# Patient Record
Sex: Male | Born: 2000 | Race: Black or African American | Hispanic: No | Marital: Single
Health system: Southern US, Community
[De-identification: ages and names within clinical notes are randomized; demographics above are authoritative.]

## PROBLEM LIST (undated history)

## (undated) DIAGNOSIS — J45909 Unspecified asthma, uncomplicated: Secondary | ICD-10-CM

## (undated) HISTORY — PX: HERNIA REPAIR: SHX51

## (undated) HISTORY — PX: LIP REPAIR: SHX440

---
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
## 2019-03-28 ENCOUNTER — Encounter (HOSPITAL_COMMUNITY): Payer: Self-pay | Admitting: Emergency Medicine

## 2019-03-28 ENCOUNTER — Encounter (HOSPITAL_COMMUNITY): Payer: Self-pay

## 2019-03-28 ENCOUNTER — Emergency Department (HOSPITAL_COMMUNITY)
Admission: EM | Admit: 2019-03-28 | Discharge: 2019-03-28 | Discharge status: Against Medical Advice | Payer: Medicaid Other | Attending: Emergency Medicine | Admitting: Emergency Medicine

## 2019-03-28 ENCOUNTER — Other Ambulatory Visit: Payer: Self-pay

## 2019-03-28 ENCOUNTER — Ambulatory Visit (HOSPITAL_COMMUNITY)
Admission: EM | Admit: 2019-03-28 | Discharge: 2019-03-28 | Disposition: A | Discharge status: Home / Self Care | Payer: Medicaid Other | Attending: Urgent Care | Admitting: Urgent Care

## 2019-03-28 DIAGNOSIS — R21 Rash and other nonspecific skin eruption: Secondary | ICD-10-CM | POA: Diagnosis not present

## 2019-03-28 DIAGNOSIS — Z5321 Procedure and treatment not carried out due to patient leaving prior to being seen by health care provider: Secondary | ICD-10-CM | POA: Insufficient documentation

## 2019-03-28 MED ORDER — CLOTRIMAZOLE-BETAMETHASONE 1-0.05 % EX CREA: TOPICAL_CREAM | CUTANEOUS | 0 refills | Status: AC

## 2019-03-28 NOTE — ED Provider Notes (Signed)
  [facility]   MRN: [id number] DOB: 2001-01-09  Subjective:   Robert Wiggins is a 18 y.o. male presenting for 1 week history of persistent left-sided groin rash.  Patient states that the rash is very itchy and when he scratches it can stinging at times.  Believes it is related to him sweating profusely at times, worse at night when he is asleep.  He has not tried any medications for relief.  He does have a history of eczema. Denies dysuria, hematuria, urinary frequency, penile discharge, penile swelling, testicular pain, testicular swelling, anal pain, groin pain.   No current facility-administered medications for this encounter.   Current Outpatient Medications:  .  ibuprofen (ADVIL,MOTRIN) 600 MG tablet, Take 1 tablet (600 mg total) by mouth every 6 (six) hours as needed for mild pain., Disp: 12 tablet, Rfl: 0 .  prednisoLONE acetate (PRED FORTE) 1 % ophthalmic suspension, Place 2 drops into the right eye 2 (two) times daily., Disp: 5 mL, Rfl: 0 .  triamcinolone cream (KENALOG) 0.1 %, Apply 1 application topically 2 (two) times daily. (Patient not taking: Reported on 10/03/2014), Disp: 45 g, Rfl: 0   Allergies  Allergen Reactions  . Pineapple Swelling    Swelling of lips    Past Medical History:  Diagnosis Date  . Asthma      Past Surgical History:  Procedure Laterality Date  . HERNIA REPAIR    . LIP REPAIR      Family History  Problem Relation Age of Onset  . Healthy Mother   . Healthy Father     Social History   Tobacco Use  . Smoking status: Passive Smoke Exposure - Never Smoker  . Smokeless tobacco: Never Used  Substance Use Topics  . Alcohol use: No  . Drug use: No    ROS   Objective:   Vitals: BP 115/77 (BP Location: Right Arm)   Pulse 63   Temp 98.3 F (36.8 C) (Oral)   Resp 16   Wt 130 lb (59 kg)   SpO2 100%   BMI 19.77 kg/m   Physical Exam Constitutional:      Appearance: Normal appearance. He is well-developed and normal weight.   HENT:     Head: Normocephalic and atraumatic.     Right Ear: External ear normal.     Left Ear: External ear normal.     Nose: Nose normal.     Mouth/Throat:     Pharynx: Oropharynx is clear.  Eyes:     Extraocular Movements: Extraocular movements intact.     Pupils: Pupils are equal, round, and reactive to light.  Cardiovascular:     Rate and Rhythm: Normal rate.  Pulmonary:     Effort: Pulmonary effort is normal.  Genitourinary:   Neurological:     Mental Status: He is alert and oriented to person, place, and time.  Psychiatric:        Mood and Affect: Mood normal.        Behavior: Behavior normal.      Assessment and Plan :   1. Groin rash   2. Rash and nonspecific skin eruption     Will cover for tinea cruris with clotrimazole betamethasone combination. Counseled patient on potential for adverse effects with medications prescribed/recommended today, ER and return-to-clinic precautions discussed, patient verbalized understanding.    Jaynee Eagles, [place] 03/29/19 [phone]

## 2019-03-28 NOTE — ED Triage Notes (Signed)
Pt reports rash to L upper thigh/groin area X several months. Pt concerned he has STD. Denies penile discharge, dysuria, pain, etc.

## 2019-03-28 NOTE — ED Triage Notes (Signed)
Pt states he has a rash on his upper left thigh. X 1 week .

## 2022-09-28 ENCOUNTER — Emergency Department (HOSPITAL_COMMUNITY)
Admission: EM | Admit: 2022-09-28 | Discharge: 2022-09-28 | Disposition: A | Discharge status: Home / Self Care | Payer: Medicaid Other | Attending: Emergency Medicine | Admitting: Emergency Medicine

## 2022-09-28 ENCOUNTER — Other Ambulatory Visit: Payer: Self-pay

## 2022-09-28 DIAGNOSIS — S00462A Insect bite (nonvenomous) of left ear, initial encounter: Secondary | ICD-10-CM | POA: Diagnosis present

## 2022-09-28 DIAGNOSIS — W57XXXA Bitten or stung by nonvenomous insect and other nonvenomous arthropods, initial encounter: Secondary | ICD-10-CM | POA: Insufficient documentation

## 2022-09-28 MED ORDER — DOXYCYCLINE HYCLATE 100 MG PO CAPS: 100.0000 mg | ORAL_CAPSULE | Freq: Two times a day (BID) | ORAL | 0 refills | Status: AC

## 2022-09-28 MED ORDER — DOXYCYCLINE HYCLATE 100 MG PO TABS
100.0000 mg | ORAL_TABLET | Freq: Once | ORAL | Status: AC
  Administered 2022-09-28: 100 mg via ORAL
  Filled 2022-09-28: qty 1

## 2022-09-28 NOTE — Discharge Instructions (Addendum)
You were seen in the emergency department today for possible tick bite to your left ear.  We are placing you on an antibiotic called doxycycline that you will take over the next 7 days.  I attached some instructions about tick bite information.  If you begin to have systemic symptoms of infection at that are outlined in the discharge instruction reading please return to the emergency department.

## 2022-09-28 NOTE — ED Provider Notes (Signed)
[place] EMERGENCY DEPARTMENT AT [facility] Provider Note   CSN: [id number] Arrival date & time: 09/28/22  0024     History  Chief Complaint  Patient presents with   Tick Removal    Robert Wiggins is a 22 y.o. male.  With past medical history of asthma who presents to the emergency department with concern over tick.  Patient states that about 1 and half days ago he was walking back from the grocery store through the woods when he hit a tree branch.  He states that then at work yesterday he felt something bothering his left ear.  He assumed that this was a "bump" and disregarded it.  He states that today he was still having irritation to the left ear so had his girlfriend look at it when she noticed that there was a small black bug on his ear.  She removed it with tweezers.  They are concerned this was a tick.  HPI     Home Medications Prior to Admission medications   Medication Sig Start Date End Date Taking? Authorizing Provider  doxycycline (VIBRAMYCIN) 100 MG capsule Take 1 capsule (100 mg total) by mouth 2 (two) times daily for 7 days. 09/28/22 10/05/22 Yes Cristopher Peru, PA-C  clotrimazole-betamethasone (LOTRISONE) cream Apply to affected area 2 times daily. 03/28/19   Wallis Bamberg, PA-C  ibuprofen (ADVIL,MOTRIN) 600 MG tablet Take 1 tablet (600 mg total) by mouth every 6 (six) hours as needed for mild pain. 10/15/14   Marcellina Millin, MD  prednisoLONE acetate (PRED FORTE) 1 % ophthalmic suspension Place 2 drops into the right eye 2 (two) times daily. 06/15/17   Viviano Simas, NP  triamcinolone cream (KENALOG) 0.1 % Apply 1 application topically 2 (two) times daily. Patient not taking: Reported on 10/03/2014 11/28/13   Linna Hoff, MD      Allergies    Pineapple    Review of Systems   Review of Systems  Skin:  Positive for wound.  All other systems reviewed and are negative.   Physical Exam Updated Vital Signs BP 132/87   Pulse 73   Temp 98.5 F (36.9  C) (Oral)   Resp 18   SpO2 100%  Physical Exam Vitals and nursing note reviewed.  HENT:     Head: Normocephalic and atraumatic.  Eyes:     General: No scleral icterus. Pulmonary:     Effort: Pulmonary effort is normal. No respiratory distress.  Skin:    General: Skin is warm and dry.     Findings: Erythema present. No rash.     Comments: There is about a 1 to 2 mm area of mild erythema to the superior crus of the left ear  Neurological:     General: No focal deficit present.     Mental Status: He is alert and oriented to person, place, and time. Mental status is at baseline.  Psychiatric:        Mood and Affect: Mood normal.        Behavior: Behavior normal.        Thought Content: Thought content normal.        Judgment: Judgment normal.     ED Results / Procedures / Treatments   Labs (all labs ordered are listed, but only abnormal results are displayed) Labs Reviewed - No data to display  EKG None  Radiology No results found.  Procedures Procedures   Medications Ordered in ED Medications  doxycycline (VIBRA-TABS) tablet 100 mg (has  no administration in time range)    ED Course/ Medical Decision Making/ A&P   {    Medical Decision Making Initial Impression and Ddx 22 year old male who presents to the emergency department with possible tick bite Patient PMH that increases complexity of ED encounter: Asthma  Interpretation of Diagnostics I independent reviewed and interpreted the labs as followed: None  - I independently visualized the following imaging with scope of interpretation limited to determining acute life threatening conditions related to emergency care: Not indictated  Patient Reassessment and Ultimate Disposition/Management 22 year old male who presents to the emergency department with possible tick bite.  His physical exam with mild erythema to the superior crus of the left ear.  He has no other systemic symptoms.  There is no picture of the  bug that was removed from his left ear.  From their description the insect was small and does not sound engorged.  This is occurred in the past 36 hours.  Will give him a dose of doxycycline here and place him on empiric doxycycline.  Feel that blood testing for RMSF or Lyme would be low yield at this point.  Will empirically treat him and have return precautions for any onset of symptoms of tickborne illness.  The patient has been appropriately medically screened and/or stabilized in the ED. I have low suspicion for any other emergent medical condition which would require further screening, evaluation or treatment in the ED or require inpatient management. At time of discharge the patient is hemodynamically stable and in no acute distress. I have discussed work-up results and diagnosis with patient and answered all questions. Patient is agreeable with discharge plan. We discussed strict return precautions for returning to the emergency department and they verbalized understanding.     Patient management required discussion with the following services or consulting groups:  None  Complexity of Problems Addressed Acute uncomplicated illness or injury with no diagnostics  Additional Data Reviewed and Analyzed Further history obtained from: Further history from spouse/family member, Past medical history and medications listed in the EMR, and Care Everywhere  Patient Encounter Risk Assessment Prescriptions  Final Clinical Impression(s) / ED Diagnoses Final diagnoses:  Tick bite of left ear, initial encounter    Rx / DC Orders ED Discharge Orders          Ordered    doxycycline (VIBRAMYCIN) 100 MG capsule  2 times daily        09/28/22 0315              Cristopher Peru, PA-C 09/28/22 0315    Sloan Leiter, DO 09/28/22 [phone]

## 2022-09-28 NOTE — ED Triage Notes (Signed)
Patient coming to ED for evaluation of tick bite to L ear.  Reports finding a tick this evening on L ear.  Had family member remove tick.  Small red mark noted to ear lobe.  No reports of fever.

## 2023-02-18 ENCOUNTER — Emergency Department (HOSPITAL_COMMUNITY)
Admission: EM | Admit: 2023-02-18 | Discharge: 2023-02-18 | Disposition: A | Discharge status: Home / Self Care | Payer: Medicaid Other | Attending: Emergency Medicine | Admitting: Emergency Medicine

## 2023-02-18 ENCOUNTER — Emergency Department (HOSPITAL_COMMUNITY): Payer: Medicaid Other

## 2023-02-18 ENCOUNTER — Other Ambulatory Visit: Payer: Self-pay

## 2023-02-18 ENCOUNTER — Encounter (HOSPITAL_COMMUNITY): Payer: Self-pay

## 2023-02-18 DIAGNOSIS — J45909 Unspecified asthma, uncomplicated: Secondary | ICD-10-CM | POA: Diagnosis not present

## 2023-02-18 DIAGNOSIS — I951 Orthostatic hypotension: Secondary | ICD-10-CM | POA: Insufficient documentation

## 2023-02-18 DIAGNOSIS — R55 Syncope and collapse: Secondary | ICD-10-CM | POA: Diagnosis present

## 2023-02-18 LAB — BASIC METABOLIC PANEL
Anion gap: 10 (ref 5–15)
BUN: 6 mg/dL (ref 6–20)
CO2: 25 mmol/L (ref 22–32)
Calcium: 8.9 mg/dL (ref 8.9–10.3)
Chloride: 106 mmol/L (ref 98–111)
Creatinine, Ser: 0.91 mg/dL (ref 0.61–1.24)
GFR, Estimated: >60 mL/min (ref >60)
Glucose, Bld: 94 mg/dL (ref 70–99)
Potassium: 4.3 mmol/L (ref 3.5–5.1)
Sodium: 141 mmol/L (ref 135–145)

## 2023-02-18 LAB — CBC
HCT: 40.2 % (ref 39.0–52.0)
Hemoglobin: 12.5 g/dL — ABNORMAL LOW (ref 13.0–17.0)
MCH: 28.2 pg (ref 26.0–34.0)
MCHC: 31.1 g/dL (ref 30.0–36.0)
MCV: 90.7 fL (ref 80.0–100.0)
Platelets: 217 10*3/uL (ref 150–400)
RBC: 4.43 MIL/uL (ref 4.22–5.81)
RDW: 12.2 % (ref 11.5–15.5)
WBC: 6.1 10*3/uL (ref 4.0–10.5)
nRBC: 0 % (ref 0.0–0.2)

## 2023-02-18 LAB — TROPONIN I (HIGH SENSITIVITY)
Troponin I (High Sensitivity): <2 ng/L (ref <18)
Troponin I (High Sensitivity): 4 ng/L (ref <18)

## 2023-02-18 NOTE — ED Provider Notes (Signed)
[facility] Provider Note   CSN: [id number] Arrival date & time: 02/18/23  1705     History  Chief Complaint  Patient presents with   Chest Pain   Loss of Consciousness    Robert Wiggins is a 22 y.o. male.  Pt is a 22 yo male with pmhx significant for asthma.  Pt has had a few episodes where he feels like he's going to pass out when he stands up.  Today, his grandma said he did pass out.  Pt does admit to donating plasma.  He feels well now.        Home Medications Prior to Admission medications   Medication Sig Start Date End Date Taking? Authorizing Provider  clotrimazole-betamethasone (LOTRISONE) cream Apply to affected area 2 times daily. 03/28/19   Wallis Bamberg, PA-C  ibuprofen (ADVIL,MOTRIN) 600 MG tablet Take 1 tablet (600 mg total) by mouth every 6 (six) hours as needed for mild pain. 10/15/14   Marcellina Millin, MD  prednisoLONE acetate (PRED FORTE) 1 % ophthalmic suspension Place 2 drops into the right eye 2 (two) times daily. 06/15/17   Viviano Simas, NP  triamcinolone cream (KENALOG) 0.1 % Apply 1 application topically 2 (two) times daily. Patient not taking: Reported on 10/03/2014 11/28/13   Linna Hoff, MD      Allergies    Pineapple    Review of Systems   Review of Systems  Neurological:  Positive for syncope.  All other systems reviewed and are negative.   Physical Exam Updated Vital Signs BP 124/78 (BP Location: Right Arm)   Pulse (!) 56   Temp 98.8 F (37.1 C)   Resp 17   Ht 5\' 8"  (1.727 m)   Wt 59 kg   SpO2 100%   BMI 19.78 kg/m  Physical Exam Vitals and nursing note reviewed.  Constitutional:      Appearance: He is well-developed.  HENT:     Head: Normocephalic and atraumatic.  Eyes:     Extraocular Movements: Extraocular movements intact.     Pupils: Pupils are equal, round, and reactive to light.  Cardiovascular:     Rate and Rhythm: Normal rate and regular rhythm.     Heart sounds:  Normal heart sounds.  Pulmonary:     Effort: Pulmonary effort is normal.     Breath sounds: Normal breath sounds.  Abdominal:     General: Bowel sounds are normal.     Palpations: Abdomen is soft.  Musculoskeletal:        General: Normal range of motion.     Cervical back: Normal range of motion and neck supple.  Skin:    General: Skin is warm and dry.     Capillary Refill: Capillary refill takes less than 2 seconds.  Neurological:     General: No focal deficit present.     Mental Status: He is alert and oriented to person, place, and time.  Psychiatric:        Mood and Affect: Mood normal.        Behavior: Behavior normal.     ED Results / Procedures / Treatments   Labs (all labs ordered are listed, but only abnormal results are displayed) Labs Reviewed  CBC - Abnormal; Notable for the following components:      Result Value   Hemoglobin 12.5 (*)    All other components within normal limits  BASIC METABOLIC PANEL  TROPONIN I (HIGH SENSITIVITY)  TROPONIN I (HIGH  SENSITIVITY)    EKG EKG Interpretation Date/Time:  Friday February 18 2023 17:18:39 EDT Ventricular Rate:  72 PR Interval:  124 QRS Duration:  90 QT Interval:  388 QTC Calculation: 424 R Axis:   83  Text Interpretation: Normal sinus rhythm Normal ECG No previous ECGs available Confirmed by Jacalyn Lefevre [id number]) on 02/18/2023 9:24:14 PM  Radiology DG Chest 2 View  Result Date: 02/18/2023 CLINICAL DATA:  Chest pain EXAM: CHEST - 2 VIEW COMPARISON:  None Available. FINDINGS: The heart size and mediastinal contours are within normal limits. Both lungs are clear. The visualized skeletal structures are unremarkable. IMPRESSION: No active cardiopulmonary disease. Electronically Signed   By: Minerva Fester M.D.   On: 02/18/2023 19:35    Procedures Procedures    Medications Ordered in ED Medications - No data to display  ED Course/ Medical Decision Making/ A&P                                 Medical  Decision Making Amount and/or Complexity of Data Reviewed Labs: ordered. Radiology: ordered.   This patient presents to the ED for concern of syncope, this involves an extensive number of treatment options, and is a complaint that carries with it a high risk of complications and morbidity.  The differential diagnosis includes cardiogenic, vasovagal, orthostatic   Co morbidities that complicate the patient evaluation  asthma   Additional history obtained:  Additional history obtained from epic chart review External records from outside source obtained and reviewed including grandmother   Lab Tests:  I Ordered, and personally interpreted labs.  The pertinent results include:  cbc nl other than hgb sl low at 12.5 (no old to compare), bmp nl, trop nl   Imaging Studies ordered:  I ordered imaging studies including cxr  I independently visualized and interpreted imaging which showed nothing acute I agree with the radiologist interpretation   Cardiac Monitoring:  The patient was maintained on a cardiac monitor.  I personally viewed and interpreted the cardiac monitored which showed an underlying rhythm of: nsr   Medicines ordered and prescription drug management:   I have reviewed the patients home medicines and have made adjustments as needed   Problem List / ED Course:  Syncope:  likely orthostatic.  Pt is encouraged to increase fluid intake.  He is to avoid donating plasma.  Return if worse.    Reevaluation:  After the interventions noted above, I reevaluated the patient and found that they have :improved   Social Determinants of Health:  Lives at home   Dispostion:  After consideration of the diagnostic results and the patients response to treatment, I feel that the patent would benefit from discharge with outpatient f/u.          Final Clinical Impression(s) / ED Diagnoses Final diagnoses:  Orthostatic hypotension    Rx / DC Orders ED Discharge  Orders     None         Jacalyn Lefevre, MD 02/18/23 2323

## 2023-02-18 NOTE — ED Triage Notes (Signed)
Pt c/o int midsternal chest [id number]. Pt states had a syncopal episode earlier today. Pt states he gets diaphretic and nauseated with chest pain. Pt states when he gets up too fast he gets lightheaded and nauseated.
